# Patient Record
Sex: Male | Born: 1975 | Race: White | Hispanic: No | Marital: Single | Smoking: Former smoker
Health system: Southern US, Community
[De-identification: ages and names within clinical notes are randomized; demographics above are authoritative.]

## PROBLEM LIST (undated history)

## (undated) DIAGNOSIS — I10 Essential (primary) hypertension: Secondary | ICD-10-CM

## (undated) DIAGNOSIS — M109 Gout, unspecified: Secondary | ICD-10-CM

---

## 2015-03-31 ENCOUNTER — Emergency Department (HOSPITAL_COMMUNITY): Payer: Managed Care, Other (non HMO)

## 2015-03-31 ENCOUNTER — Emergency Department (HOSPITAL_COMMUNITY)
Admission: EM | Admit: 2015-03-31 | Discharge: 2015-03-31 | Disposition: A | Discharge status: Home / Self Care | Payer: Managed Care, Other (non HMO) | Attending: Emergency Medicine | Admitting: Emergency Medicine

## 2015-03-31 ENCOUNTER — Encounter (HOSPITAL_COMMUNITY): Payer: Self-pay

## 2015-03-31 ENCOUNTER — Emergency Department (HOSPITAL_COMMUNITY)
Admission: EM | Admit: 2015-03-31 | Discharge: 2015-04-01 | Disposition: A | Discharge status: Home / Self Care | Payer: Managed Care, Other (non HMO) | Attending: Emergency Medicine | Admitting: Emergency Medicine

## 2015-03-31 DIAGNOSIS — M1000 Idiopathic gout, unspecified site: Secondary | ICD-10-CM | POA: Diagnosis not present

## 2015-03-31 DIAGNOSIS — Z7952 Long term (current) use of systemic steroids: Secondary | ICD-10-CM | POA: Insufficient documentation

## 2015-03-31 DIAGNOSIS — I10 Essential (primary) hypertension: Secondary | ICD-10-CM | POA: Diagnosis not present

## 2015-03-31 DIAGNOSIS — M10021 Idiopathic gout, right elbow: Secondary | ICD-10-CM | POA: Diagnosis not present

## 2015-03-31 DIAGNOSIS — Z87891 Personal history of nicotine dependence: Secondary | ICD-10-CM | POA: Diagnosis not present

## 2015-03-31 DIAGNOSIS — M25421 Effusion, right elbow: Secondary | ICD-10-CM | POA: Insufficient documentation

## 2015-03-31 DIAGNOSIS — M109 Gout, unspecified: Secondary | ICD-10-CM | POA: Diagnosis present

## 2015-03-31 HISTORY — DX: Gout, unspecified: M10.9

## 2015-03-31 HISTORY — DX: Essential (primary) hypertension: I10

## 2015-03-31 LAB — CBC WITH DIFFERENTIAL/PLATELET
BASOS PCT: 0 % (ref 0–1)
Basophils Absolute: 0 10*3/uL (ref 0.0–0.1)
EOS ABS: 0.1 10*3/uL (ref 0.0–0.7)
Eosinophils Relative: 1 % (ref 0–5)
HCT: 42.7 % (ref 39.0–52.0)
Hemoglobin: 14.4 g/dL (ref 13.0–17.0)
Lymphocytes Relative: 17 % (ref 12–46)
Lymphs Abs: 1.4 10*3/uL (ref 0.7–4.0)
MCH: 30.7 pg (ref 26.0–34.0)
MCHC: 33.7 g/dL (ref 30.0–36.0)
MCV: 91 fL (ref 78.0–100.0)
Monocytes Absolute: 0.7 10*3/uL (ref 0.1–1.0)
Monocytes Relative: 8 % (ref 3–12)
Neutro Abs: 5.8 10*3/uL (ref 1.7–7.7)
Neutrophils Relative %: 74 % (ref 43–77)
Platelets: 191 10*3/uL (ref 150–400)
RBC: 4.69 MIL/uL (ref 4.22–5.81)
RDW: 13.7 % (ref 11.5–15.5)
WBC: 7.9 10*3/uL (ref 4.0–10.5)

## 2015-03-31 LAB — BASIC METABOLIC PANEL
ANION GAP: 10 (ref 5–15)
BUN: 11 mg/dL (ref 6–23)
CALCIUM: 9.3 mg/dL (ref 8.4–10.5)
CO2: 28 mmol/L (ref 19–32)
Chloride: 96 mmol/L (ref 96–112)
Creatinine, Ser: 0.91 mg/dL (ref 0.50–1.35)
GFR calc Af Amer: >90 mL/min (ref >90)
GFR calc non Af Amer: >90 mL/min (ref >90)
Glucose, Bld: 99 mg/dL (ref 70–99)
Potassium: 4 mmol/L (ref 3.5–5.1)
Sodium: 134 mmol/L — ABNORMAL LOW (ref 135–145)

## 2015-03-31 LAB — SEDIMENTATION RATE: SED RATE: 24 mm/h — AB (ref 0–16)

## 2015-03-31 MED ORDER — HYDROCODONE-ACETAMINOPHEN 5-325 MG PO TABS: 2.0000 | ORAL_TABLET | ORAL | Status: DC | PRN

## 2015-03-31 MED ORDER — COLCHICINE 0.6 MG PO TABS
1.2000 mg | ORAL_TABLET | Freq: Once | ORAL | Status: AC
  Administered 2015-03-31: 1.2 mg via ORAL
  Filled 2015-03-31: qty 2

## 2015-03-31 MED ORDER — KETOROLAC TROMETHAMINE 30 MG/ML IJ SOLN
30.0000 mg | Freq: Once | INTRAMUSCULAR | Status: AC
  Administered 2015-03-31: 30 mg via INTRAMUSCULAR
  Filled 2015-03-31: qty 1

## 2015-03-31 MED ORDER — LIDOCAINE HCL (PF) 1 % IJ SOLN
5.0000 mL | Freq: Once | INTRAMUSCULAR | Status: AC
  Administered 2015-03-31: 5 mL
  Filled 2015-03-31: qty 5

## 2015-03-31 MED ORDER — LISINOPRIL 40 MG PO TABS: 40.0000 mg | ORAL_TABLET | Freq: Every day | ORAL | Status: AC

## 2015-03-31 MED ORDER — HYDROMORPHONE HCL 1 MG/ML IJ SOLN
2.0000 mg | Freq: Once | INTRAMUSCULAR | Status: AC
  Administered 2015-03-31: 2 mg via INTRAMUSCULAR
  Filled 2015-03-31: qty 2

## 2015-03-31 MED ORDER — PREDNISONE 20 MG PO TABS
60.0000 mg | ORAL_TABLET | Freq: Once | ORAL | Status: AC
  Administered 2015-03-31: 60 mg via ORAL
  Filled 2015-03-31: qty 3

## 2015-03-31 MED ORDER — COLCHICINE 0.6 MG PO TABS: ORAL_TABLET | ORAL | Status: AC

## 2015-03-31 MED ORDER — LIDOCAINE HCL (PF) 1 % IJ SOLN
INTRAMUSCULAR | Status: AC
  Filled 2015-03-31: qty 5

## 2015-03-31 NOTE — ED Provider Notes (Signed)
Medical Screening Exam in [facility]:  Pt seen in ED last night diagnosed with gout attack in right elbow presents with worsening pain, inability to move right elbow.  Severe pain with any passive ROM.  Right elbow with significant edema.  No erythema or warmth.  Sensation, pulses intact.  Taking colchicine, norco without improvement.  Ordered xray, CBC, BMP, sed rate, CRP, IM dilaudid.  Pt to be moved to main ED for further evaluation.   Trixie Dredgemily Rashonda Warrior, PA-C 03/31/15 1936  Eber HongBrian Miller, MD 04/01/15 1420

## 2015-03-31 NOTE — ED Notes (Signed)
Phlebotomy at the bedside  

## 2015-03-31 NOTE — Discharge Instructions (Signed)

## 2015-03-31 NOTE — ED Provider Notes (Signed)
  Physical Exam  BP 124/76 mmHg  Pulse 81  Temp(Src) 97.8 F (36.6 C) (Oral)  Resp 16  SpO2 95%  Physical Exam  ED Course  ARTHOCENTESIS Date/Time: 03/31/2015 11:17 PM Performed by: Eber HongMILLER, Paschal Blanton Authorized by: Eber HongMILLER, Bryann Mcnealy Consent: Verbal consent obtained. Written consent obtained. Risks and benefits: risks, benefits and alternatives were discussed Consent given by: patient Patient understanding: patient states understanding of the procedure being performed Imaging studies: imaging studies available Patient identity confirmed: verbally with patient Time out: Immediately prior to procedure a "time out" was called to verify the correct patient, procedure, equipment, support staff and site/side marked as required. Indications: joint swelling  Body area: elbow Joint: right elbow Local anesthesia used: yes Anesthesia: local infiltration Local anesthetic: lidocaine 1% without epinephrine Anesthetic total: 3 ml Patient sedated: no Preparation: Patient was prepped and draped in the usual sterile fashion. Needle gauge: 18 G Ultrasound guidance: no Approach: lateral Aspirate: yellow and cloudy Aspirate amount: 6 mL Patient tolerance: Patient tolerated the procedure well with no immediate complications         Eber HongBrian Jule Schlabach, MD 03/31/15 2318

## 2015-03-31 NOTE — ED Notes (Signed)
[facility] would like patient to be transferred to major side.  To place orders

## 2015-03-31 NOTE — ED Notes (Signed)
Rt. Elbow swelling and pain, hx of gout was here last night treated for gout.  Given medications and pain is worse.    Pt. Has gotten no comfort  From the meds.

## 2015-03-31 NOTE — ED Notes (Signed)
As noted earlier, pt was seen here yesterday for right elbow pain, swelling, and limited movement.  Was dc'ed with pain meds.  Not improved.  States he   Was offered to have joint drained yesterday but declined, would like to readdress that option if it is still available.

## 2015-03-31 NOTE — ED Provider Notes (Signed)
CSN: [id number]     Arrival date & time 03/31/15  1516 History   First MD Initiated Contact with Patient 03/31/15 1901     Chief Complaint  Patient presents with  . Joint Swelling     (Consider location/radiation/quality/duration/timing/severity/associated sxs/prior Treatment) HPI Comments: Was here last night and treated for presumed Gout in R elbow has taken Colchicine as directed and Vicodin for pain with worsening of symptoms   The history is provided by the patient.    Past Medical History  Diagnosis Date  . Gout   . Hypertension    History reviewed. No pertinent past surgical history. No family history on file. History  Substance Use Topics  . Smoking status: Former Games developer  . Smokeless tobacco: Not on file  . Alcohol Use: No    Review of Systems  Constitutional: Negative for fever.  Musculoskeletal: Positive for arthralgias. Negative for joint swelling.  Skin: Negative for color change.  All other systems reviewed and are negative.     Allergies  Review of patient's allergies indicates no known allergies.  Home Medications   Prior to Admission medications   Medication Sig Start Date End Date Taking? Authorizing Provider  acetaminophen (TYLENOL) 500 MG tablet Take 500 mg by mouth every 6 (six) hours as needed for mild pain or moderate pain.   Yes Historical Provider, MD  colchicine 0.6 MG tablet Take 0.6mg  (one tablet) by mouth every 1-2 hours until one of the following occurs: 1.  The pain is gone 2.  The maximum dose has been given ( no more than 3 tabs in 3 hours or 10 tabs in 24 hours) 3.  The side effects outweight the benefits 03/31/15  Yes Eber Hong, MD  ibuprofen (ADVIL,MOTRIN) 200 MG tablet Take 200 mg by mouth every 6 (six) hours as needed for mild pain or moderate pain.   Yes Historical Provider, MD  lisinopril (PRINIVIL,ZESTRIL) 40 MG tablet Take 1 tablet (40 mg total) by mouth daily. 03/31/15  Yes Eber Hong, MD  Misc Natural Products (APPLE  CIDER VINEGAR) TABS Take 1 tablet by mouth once.   Yes Historical Provider, MD  HYDROcodone-acetaminophen (NORCO/VICODIN) 5-325 MG per tablet Take 2 tablets by mouth every 4 (four) hours as needed. 04/01/15   Eber Hong, MD  predniSONE (DELTASONE) 20 MG tablet Take 2 tablets (40 mg total) by mouth daily. 04/01/15   Eber Hong, MD   BP 122/76 mmHg  Pulse 91  Temp(Src) 98 F (36.7 C) (Oral)  Resp 16  SpO2 98% Physical Exam  Constitutional: He is oriented to person, place, and time. He appears well-developed and well-nourished.  HENT:  Head: Normocephalic.  Eyes: Pupils are equal, round, and reactive to light.  Neck: Normal range of motion.  Cardiovascular: Normal rate and regular rhythm.   Pulmonary/Chest: Effort normal and breath sounds normal.  Musculoskeletal: He exhibits tenderness. He exhibits no edema.       Right elbow: He exhibits decreased range of motion. He exhibits no swelling, no effusion, no deformity and no laceration. Tenderness found. No olecranon process tenderness noted.  Minimal swelling + decreased ROM + distal pulses with no swelling/edema distal to elbow   Neurological: He is alert and oriented to person, place, and time.  Skin: Skin is warm. No erythema.  Nursing note and vitals reviewed.   ED Course  Procedures (including critical care time) Labs Review Labs Reviewed  BASIC METABOLIC PANEL - Abnormal; Notable for the following:    Sodium 134 (*)  All other components within normal limits  SEDIMENTATION RATE - Abnormal; Notable for the following:    Sed Rate 24 (*)    All other components within normal limits  C-REACTIVE PROTEIN - Abnormal; Notable for the following:    CRP 3.6 (*)    All other components within normal limits  SYNOVIAL CELL COUNT + DIFF, W/ CRYSTALS - Abnormal; Notable for the following:    Color, Synovial STRAW (*)    Appearance-Synovial TURBID (*)    WBC, Synovial 984 (*)    Neutrophil, Synovial 92 (*)     Monocyte-Macrophage-Synovial Fluid 6 (*)    All other components within normal limits  BODY FLUID CULTURE  GRAM STAIN  CBC WITH DIFFERENTIAL/PLATELET    Imaging Review Dg Elbow Complete Right  03/31/2015   CLINICAL DATA:  Joint swelling, diagnosis of gout attack.  EXAM: RIGHT ELBOW - COMPLETE 3+ VIEW  COMPARISON:  None.  FINDINGS: Focal soft tissue swelling about the olecranon. A moderate to large joint effusion is also present. No soft tissue mineralization. No fracture, malalignment, or bone erosion.  IMPRESSION: 1. Elbow joint effusion and olecranon bursitis. 2. No bony abnormality.   Electronically Signed   By: Marnee SpringJonathon  Watts M.D.   On: 03/31/2015 21:14     EKG Interpretation None      MDM   Final diagnoses:  Acute gouty arthritis         Earley FavorGail Verdia Bolt, NP 04/01/15 1953  Blake DivineJohn Wofford, MD 04/04/15 [phone]

## 2015-03-31 NOTE — ED Provider Notes (Signed)
CSN: [id number]     Arrival date & time 03/31/15  0428 History   First MD Initiated Contact with Patient 03/31/15 [phone]     Chief Complaint  Patient presents with  . Gout     (Consider location/radiation/quality/duration/timing/severity/associated sxs/prior Treatment) HPI Comments: The patient is a 39 year old male, he has a history of gout in his large toe, he also has a history of elbow pain that started at 9:00 today, this is not exactly similar to prior bursitis which she does have in the past, he has noted some swelling of his right elbow. This is persistent, worse with range of motion, not associated with fevers redness or warmth  The history is provided by the patient.    Past Medical History  Diagnosis Date  . Gout   . Hypertension    History reviewed. No pertinent past surgical history. No family history on file. History  Substance Use Topics  . Smoking status: Former Games developer  . Smokeless tobacco: Not on file  . Alcohol Use: No    Review of Systems  Constitutional: Negative for fever and chills.  Musculoskeletal: Positive for joint swelling.      Allergies  Review of patient's allergies indicates no known allergies.  Home Medications   Prior to Admission medications   Medication Sig Start Date End Date Taking? Authorizing Provider  colchicine 0.6 MG tablet Take 0.6mg  (one tablet) by mouth every 1-2 hours until one of the following occurs: 1.  The pain is gone 2.  The maximum dose has been given ( no more than 3 tabs in 3 hours or 10 tabs in 24 hours) 3.  The side effects outweight the benefits 03/31/15   Eber Hong, MD  HYDROcodone-acetaminophen (NORCO/VICODIN) 5-325 MG per tablet Take 2 tablets by mouth every 4 (four) hours as needed. 03/31/15   Eber Hong, MD  lisinopril (PRINIVIL,ZESTRIL) 40 MG tablet Take 1 tablet (40 mg total) by mouth daily. 03/31/15   Eber Hong, MD   BP 167/123 mmHg  Pulse 92  Temp(Src) 97 F (36.1 C) (Oral)  Resp 18  Ht   (1.93 m)  Wt 260 lb (117.935 kg)  BMI 31.66 kg/m2  SpO2 100% Physical Exam  Constitutional: He appears well-developed and well-nourished. No distress.  HENT:  Head: Normocephalic and atraumatic.  Eyes: Conjunctivae are normal. No scleral icterus.  Cardiovascular: Normal rate, regular rhythm and intact distal pulses.   Pulmonary/Chest: Effort normal and breath sounds normal.  Musculoskeletal: He exhibits tenderness. He exhibits no edema.  Pain with range of motion of the right elbow, joint effusion present  Neurological: He is alert.  Skin: Skin is warm and dry. No rash noted. He is not diaphoretic.  Nursing note and vitals reviewed.   ED Course  Procedures (including critical care time) Labs Review Labs Reviewed - No data to display  Imaging Review No results found.    MDM   Final diagnoses:  Acute gouty arthritis  Essential hypertension    The patient has a elbow joint effusion, he declines arthrocentesis, he will agree to treatment of acute gouty arthritis, see medications below. He also requests refill on his blood pressure medications, this has been given, he appears stable for discharge, he will take his blood pressure medication starting this morning. Despite hypertension the patient is not having any chest pain or neurologic symptoms. No indication for further workup.  Meds given in ED:  Medications  colchicine tablet 1.2 mg (not administered)    New Prescriptions  COLCHICINE 0.6 MG TABLET    Take 0.6mg  (one tablet) by mouth every 1-2 hours until one of the following occurs: 1.  The pain is gone 2.  The maximum dose has been given ( no more than 3 tabs in 3 hours or 10 tabs in 24 hours) 3.  The side effects outweight the benefits   HYDROCODONE-ACETAMINOPHEN (NORCO/VICODIN) 5-325 MG PER TABLET    Take 2 tablets by mouth every 4 (four) hours as needed.   LISINOPRIL (PRINIVIL,ZESTRIL) 40 MG TABLET    Take 1 tablet (40 mg total) by mouth daily.        Eber HongBrian  Aeliana Spates, MD 03/31/15 [phone]

## 2015-03-31 NOTE — ED Notes (Signed)
C/o elbow pain starting at 9pm today, pt states he has hx of gout burcitiis.

## 2015-04-01 LAB — SYNOVIAL CELL COUNT + DIFF, W/ CRYSTALS
LYMPHOCYTES-SYNOVIAL FLD: 2 % (ref 0–20)
Monocyte-Macrophage-Synovial Fluid: 6 % — ABNORMAL LOW (ref 50–90)
NEUTROPHIL, SYNOVIAL: 92 % — AB (ref 0–25)
WBC, Synovial: 984 /mm3 — ABNORMAL HIGH (ref 0–200)

## 2015-04-01 LAB — GRAM STAIN

## 2015-04-01 LAB — C-REACTIVE PROTEIN: CRP: 3.6 mg/dL — AB (ref <0.60)

## 2015-04-01 MED ORDER — HYDROCODONE-ACETAMINOPHEN 5-325 MG PO TABS: 2.0000 | ORAL_TABLET | ORAL | Status: AC | PRN

## 2015-04-01 MED ORDER — PREDNISONE 20 MG PO TABS: 40.0000 mg | ORAL_TABLET | Freq: Every day | ORAL | Status: AC

## 2015-04-01 NOTE — ED Notes (Addendum)
Main lab called to check when synovial fluid will result, states about 15-30 minutes. Pt family made aware.

## 2015-04-01 NOTE — Discharge Instructions (Signed)
Please call your doctor for a followup appointment within 24-48 hours. When you talk to your doctor please let them know that you were seen in the emergency department and have them acquire all of your records so that they can discuss the findings with you and formulate a treatment plan to fully care for your new and ongoing problems.  Your samples showed GOUT - no infection seen!

## 2015-04-01 NOTE — ED Notes (Signed)
Main lab called again to check when synovial fluid study will result, states will result in about about 10-15 minutes. Pt and family made aware.

## 2015-04-04 LAB — BODY FLUID CULTURE: CULTURE: NO GROWTH

## 2016-06-27 IMAGING — CR DG ELBOW COMPLETE 3+V*R*
4 series · 4 of 4 positions shown · non-contrast
Comparison: None.

CLINICAL DATA: Joint swelling, diagnosis of gout attack.

EXAM:
RIGHT ELBOW - COMPLETE 3+ VIEW

[elbow ap]
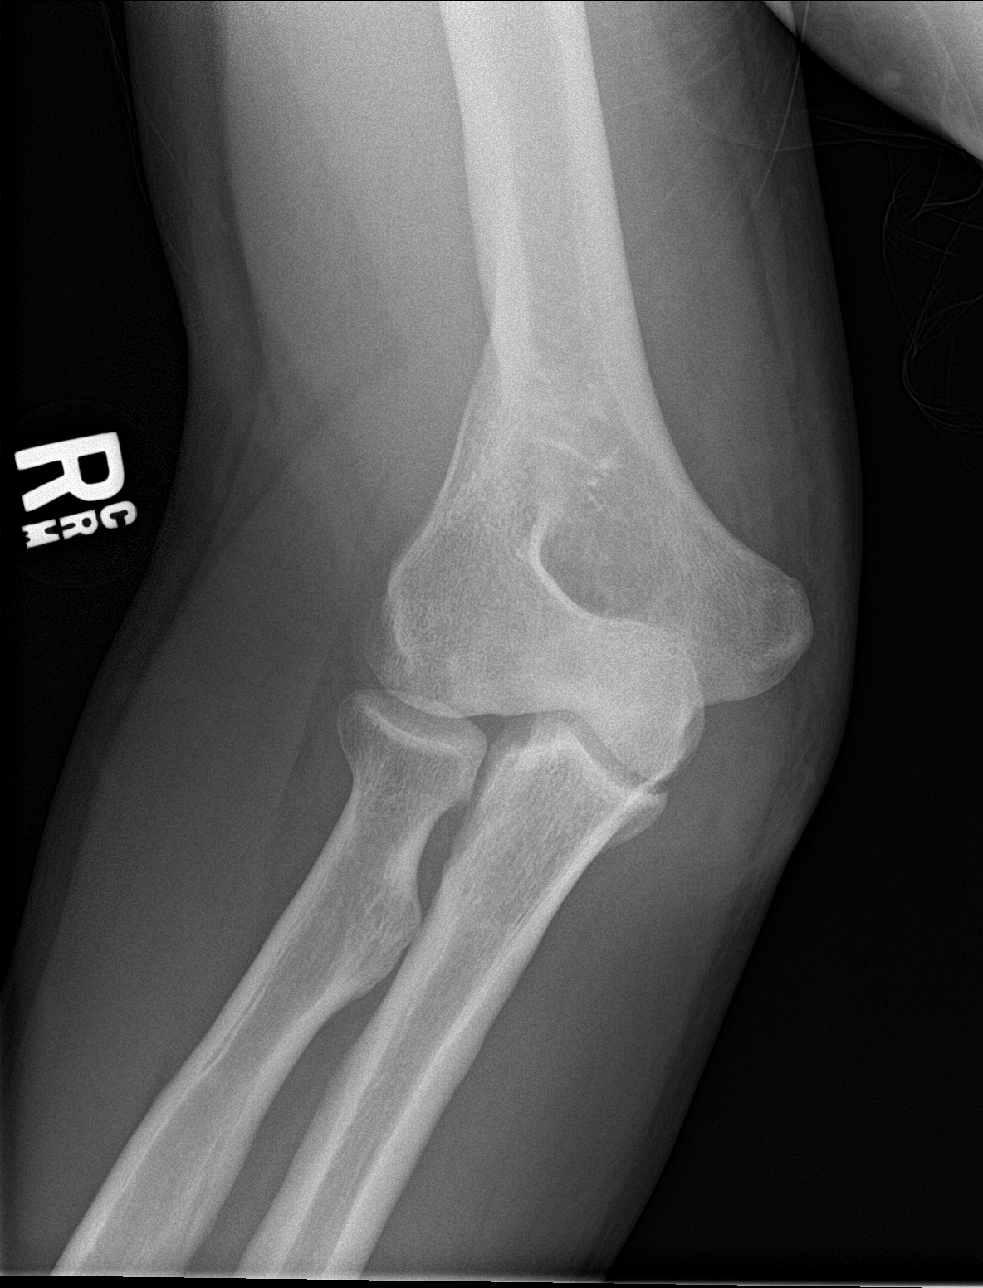

[elbow obl (1 of 2)]
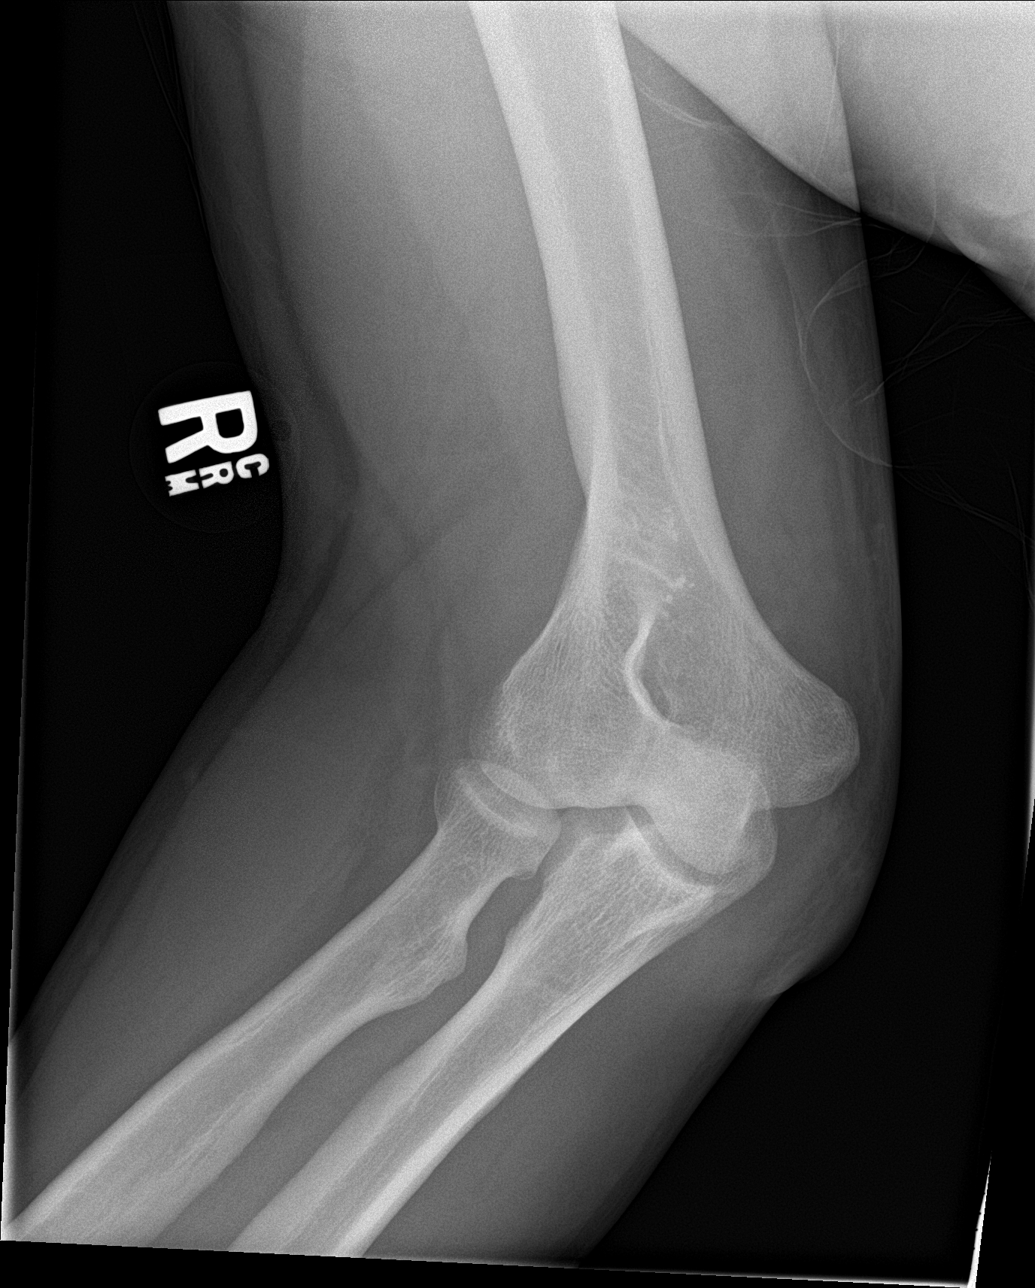

[elbow obl (2 of 2)]
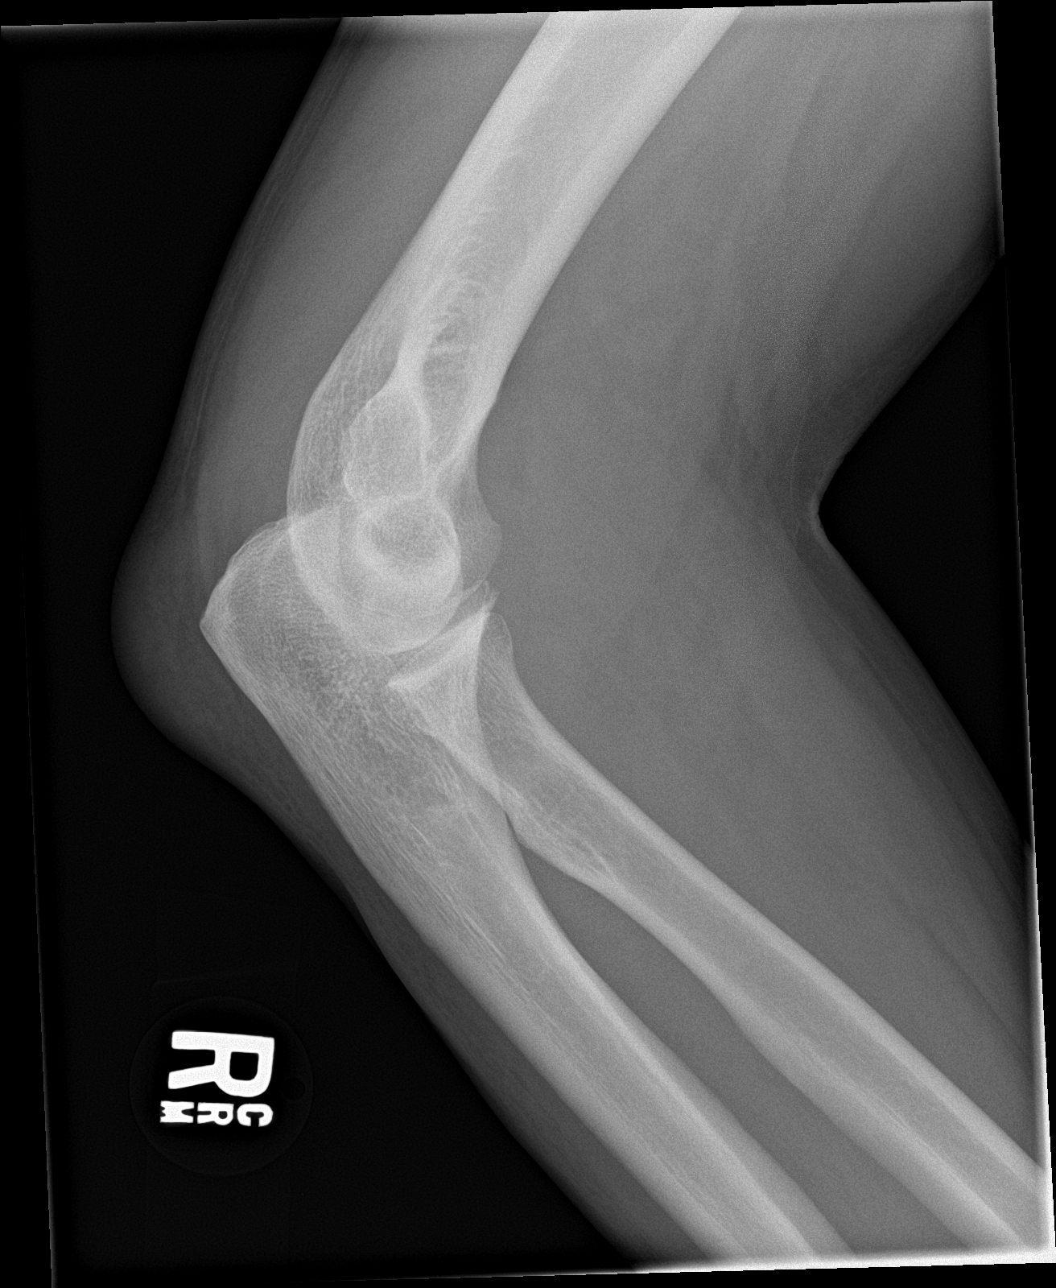

[elbow lat]
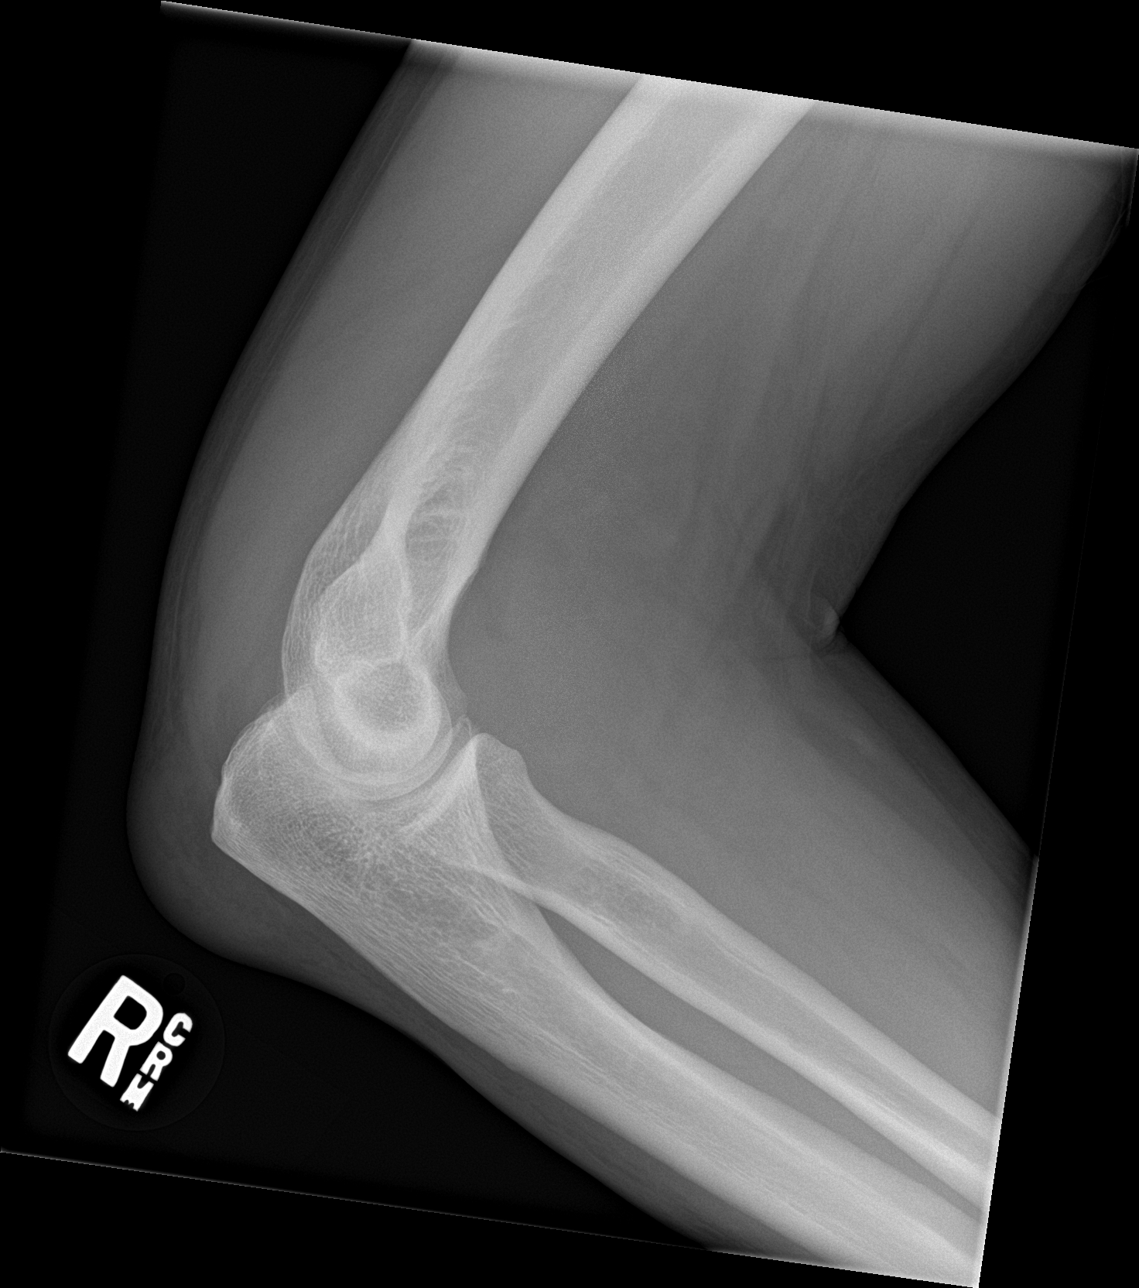

[4 of 4 positions shown; findings below may reference images not displayed]

FINDINGS: Focal soft tissue swelling about the olecranon. A moderate to large
joint effusion is also present. No soft tissue mineralization. No
fracture, malalignment, or bone erosion.
IMPRESSION: 1. Elbow joint effusion and olecranon bursitis.
2. No bony abnormality.
